# Patient Record
Sex: Female | Born: 1990 | Race: Black or African American | Hispanic: No | Marital: Married | Smoking: Never smoker
Health system: Southern US, Community
[De-identification: ages and names within clinical notes are randomized; demographics above are authoritative.]

---

## 2013-12-06 ENCOUNTER — Emergency Department (HOSPITAL_COMMUNITY)
Admission: EM | Admit: 2013-12-06 | Discharge: 2013-12-06 | Disposition: A | Discharge status: Home / Self Care | Payer: Medicaid - Out of State | Attending: Emergency Medicine | Admitting: Emergency Medicine

## 2013-12-06 ENCOUNTER — Encounter (HOSPITAL_COMMUNITY): Payer: Self-pay | Admitting: Emergency Medicine

## 2013-12-06 ENCOUNTER — Emergency Department (HOSPITAL_COMMUNITY): Payer: Medicaid - Out of State

## 2013-12-06 DIAGNOSIS — Z7982 Long term (current) use of aspirin: Secondary | ICD-10-CM | POA: Diagnosis not present

## 2013-12-06 DIAGNOSIS — R1013 Epigastric pain: Secondary | ICD-10-CM | POA: Diagnosis not present

## 2013-12-06 DIAGNOSIS — R110 Nausea: Secondary | ICD-10-CM | POA: Insufficient documentation

## 2013-12-06 DIAGNOSIS — O9989 Other specified diseases and conditions complicating pregnancy, childbirth and the puerperium: Secondary | ICD-10-CM | POA: Insufficient documentation

## 2013-12-06 DIAGNOSIS — Z3490 Encounter for supervision of normal pregnancy, unspecified, unspecified trimester: Secondary | ICD-10-CM

## 2013-12-06 LAB — CBC WITH DIFFERENTIAL/PLATELET
BASOS ABS: 0 10*3/uL (ref 0.0–0.1)
Basophils Relative: 0 % (ref 0–1)
Eosinophils Absolute: 0.1 10*3/uL (ref 0.0–0.7)
Eosinophils Relative: 1 % (ref 0–5)
HCT: 31.2 % — ABNORMAL LOW (ref 36.0–46.0)
Hemoglobin: 9.7 g/dL — ABNORMAL LOW (ref 12.0–15.0)
LYMPHS ABS: 2.5 10*3/uL (ref 0.7–4.0)
LYMPHS PCT: 30 % (ref 12–46)
MCH: 21.7 pg — ABNORMAL LOW (ref 26.0–34.0)
MCHC: 31.1 g/dL (ref 30.0–36.0)
MCV: 70 fL — ABNORMAL LOW (ref 78.0–100.0)
Monocytes Absolute: 0.4 10*3/uL (ref 0.1–1.0)
Monocytes Relative: 5 % (ref 3–12)
NEUTROS ABS: 5.3 10*3/uL (ref 1.7–7.7)
NEUTROS PCT: 64 % (ref 43–77)
Platelets: 276 10*3/uL (ref 150–400)
RBC: 4.46 MIL/uL (ref 3.87–5.11)
RDW: 17.6 % — AB (ref 11.5–15.5)
WBC: 8.3 10*3/uL (ref 4.0–10.5)

## 2013-12-06 LAB — COMPREHENSIVE METABOLIC PANEL
ALK PHOS: 59 U/L (ref 39–117)
ALT: 16 U/L (ref 0–35)
AST: 16 U/L (ref 0–37)
Albumin: 3.4 g/dL — ABNORMAL LOW (ref 3.5–5.2)
Anion gap: 15 (ref 5–15)
BUN: 8 mg/dL (ref 6–23)
CO2: 22 mEq/L (ref 19–32)
Calcium: 8.4 mg/dL (ref 8.4–10.5)
Chloride: 99 mEq/L (ref 96–112)
Creatinine, Ser: 0.66 mg/dL (ref 0.50–1.10)
GFR calc Af Amer: >90 mL/min (ref >90)
Glucose, Bld: 109 mg/dL — ABNORMAL HIGH (ref 70–99)
POTASSIUM: 4 meq/L (ref 3.7–5.3)
SODIUM: 136 meq/L — AB (ref 137–147)
Total Bilirubin: 0.4 mg/dL (ref 0.3–1.2)
Total Protein: 7.1 g/dL (ref 6.0–8.3)

## 2013-12-06 LAB — URINALYSIS, ROUTINE W REFLEX MICROSCOPIC
Bilirubin Urine: NEGATIVE
GLUCOSE, UA: NEGATIVE mg/dL
HGB URINE DIPSTICK: NEGATIVE
KETONES UR: NEGATIVE mg/dL
Nitrite: NEGATIVE
PH: 5.5 (ref 5.0–8.0)
Protein, ur: NEGATIVE mg/dL
Specific Gravity, Urine: >1.03 — ABNORMAL HIGH (ref 1.005–1.030)
Urobilinogen, UA: 0.2 mg/dL (ref 0.0–1.0)

## 2013-12-06 LAB — URINE MICROSCOPIC-ADD ON

## 2013-12-06 LAB — HCG, QUANTITATIVE, PREGNANCY: hCG, Beta Chain, Quant, S: 14160 m[IU]/mL — ABNORMAL HIGH (ref <5)

## 2013-12-06 LAB — POC URINE PREG, ED: Preg Test, Ur: POSITIVE — AB

## 2013-12-06 NOTE — ED Provider Notes (Signed)
CSN: [id number]     Arrival date & time 12/06/13  1535 History   First MD Initiated Contact with Patient 12/06/13 1708     Chief Complaint  Patient presents with  . Abdominal Pain     (Consider location/radiation/quality/duration/timing/severity/associated sxs/prior Treatment) HPI Comments: 73F [id number] presents with nausea and vomiting. Has had it intermittently for past 7-8 months, roughly an hour every other day. This all began after she was told she had an ectopic pregnancy that suddenly went away - didn't require meds or intervention. 3 days now of constant pain and nausea.  Patient is a 23 y.o. female presenting with abdominal pain. The history is provided by the patient.  Abdominal Pain Pain location:  Epigastric Pain quality: aching   Pain radiates to:  Does not radiate Pain severity:  Moderate Onset quality:  Gradual Duration:  3 days Timing:  Constant Progression:  Worsening Chronicity:  Recurrent Context: not alcohol use, not eating, not previous surgeries and not suspicious food intake   Associated symptoms: nausea   Associated symptoms: no chills, no cough, no fever, no shortness of breath, no vaginal bleeding and no vaginal discharge     History reviewed. No pertinent past medical history. History reviewed. No pertinent past surgical history. History reviewed. No pertinent family history. History  Substance Use Topics  . Smoking status: Never Smoker   . Smokeless tobacco: Not on file  . Alcohol Use: No   OB History   Grav Para Term Preterm Abortions TAB SAB Ect Mult Living                 Review of Systems  Constitutional: Negative for fever and chills.  Respiratory: Negative for cough and shortness of breath.   Gastrointestinal: Positive for nausea and abdominal pain.  Genitourinary: Negative for decreased urine volume, vaginal bleeding, vaginal discharge, difficulty urinating and vaginal pain.  All other systems reviewed and are  negative.     Allergies  Review of patient's allergies indicates no known allergies.  Home Medications   Prior to Admission medications   Medication Sig Start Date End Date Taking? Authorizing Provider  aspirin EC 325 MG tablet Take 325 mg by mouth daily.   Yes Historical Provider, MD   BP 107/69  Pulse 97  Temp(Src) 98.8 F (37.1 C)  Resp 16  SpO2 100%  LMP 10/12/2013 Physical Exam  Nursing note and vitals reviewed. Constitutional: She is oriented to person, place, and time. She appears well-developed and well-nourished. No distress.  HENT:  Head: Normocephalic and atraumatic.  Eyes: EOM are normal. Pupils are equal, round, and reactive to light.  Neck: Normal range of motion. Neck supple.  Cardiovascular: Normal rate and regular rhythm.  Exam reveals no friction rub.   No murmur heard. Pulmonary/Chest: Effort normal and breath sounds normal. No respiratory distress. She has no wheezes. She has no rales.  Abdominal: Soft. She exhibits no distension. There is no tenderness. There is no rebound.  Musculoskeletal: Normal range of motion. She exhibits no edema.  Neurological: She is alert and oriented to person, place, and time.  Skin: She is not diaphoretic.    ED Course  Procedures (including critical care time) Labs Review Labs Reviewed  CBC WITH DIFFERENTIAL - Abnormal; Notable for the following:    Hemoglobin 9.7 (*)    HCT 31.2 (*)    MCV 70.0 (*)    MCH 21.7 (*)    RDW 17.6 (*)    All other components within normal limits  COMPREHENSIVE METABOLIC PANEL - Abnormal; Notable for the following:    Sodium 136 (*)    Glucose, Bld 109 (*)    Albumin 3.4 (*)    All other components within normal limits  URINALYSIS, ROUTINE W REFLEX MICROSCOPIC - Abnormal; Notable for the following:    Specific Gravity, Urine >1.030 (*)    Leukocytes, UA TRACE (*)    All other components within normal limits  URINE MICROSCOPIC-ADD ON - Abnormal; Notable for the following:     Squamous Epithelial / LPF FEW (*)    Bacteria, UA FEW (*)    All other components within normal limits  POC URINE PREG, ED - Abnormal; Notable for the following:    Preg Test, Ur POSITIVE (*)    All other components within normal limits  HCG, QUANTITATIVE, PREGNANCY    Imaging Review US Ob Comp Less 14 Wks  12/06/2013   CLINICAL DATA:  23 year old pregnant female with pelvic pain. Estimated gestational age of [redacted] weeks 6 day by LMP. Beta HCG of 14,160  EXAM: OBSTETRIC <14 WK [place] AND TRANSVAGINAL OB US  TECHNIQUE: Both transabdominal and transvaginal ultrasound examinations were performed for complete evaluation of the gestation as well as the maternal uterus, adnexal regions, and pelvic cul-de-sac. Transvaginal technique was performed to assess early pregnancy.  COMPARISON:  None.  FINDINGS: Intrauterine gestational sac: Visualized/normal in shape.  Yolk sac:  Visualized  Embryo:  Not visualized  Cardiac Activity: Not visualized  MSD:  12.3  mm   6 w   0  d                [place] EDC: 08/01/2014  Maternal uterus/adnexae: There is no evidence of subchorionic hemorrhage.  A 1.7 x 2.2 cm right ovarian cyst is present.  The left ovary is unremarkable.  There is no evidence of free fluid or solid adnexal mass.  IMPRESSION: Single intrauterine gestational sac containing yolk sac. Fetal pole is not identified at this time. No evidence of subchorionic hemorrhage.  1.7 x 2.2 cm right ovarian cyst.   Electronically Signed   By: Laveda Abbe M.D.   On: 12/06/2013 19:58   US Ob Transvaginal  12/06/2013   CLINICAL DATA:  23 year old pregnant female with pelvic pain. Estimated gestational age of [redacted] weeks 6 day by LMP. Beta HCG of 14,160  EXAM: OBSTETRIC <14 WK [place] AND TRANSVAGINAL OB US  TECHNIQUE: Both transabdominal and transvaginal ultrasound examinations were performed for complete evaluation of the gestation as well as the maternal uterus, adnexal regions, and pelvic cul-de-sac. Transvaginal technique was performed to assess  early pregnancy.  COMPARISON:  None.  FINDINGS: Intrauterine gestational sac: Visualized/normal in shape.  Yolk sac:  Visualized  Embryo:  Not visualized  Cardiac Activity: Not visualized  MSD:  12.3  mm   6 w   0  d                [place] EDC: 08/01/2014  Maternal uterus/adnexae: There is no evidence of subchorionic hemorrhage.  A 1.7 x 2.2 cm right ovarian cyst is present.  The left ovary is unremarkable.  There is no evidence of free fluid or solid adnexal mass.  IMPRESSION: Single intrauterine gestational sac containing yolk sac. Fetal pole is not identified at this time. No evidence of subchorionic hemorrhage.  1.7 x 2.2 cm right ovarian cyst.   Electronically Signed   By: Laveda Abbe M.D.   On: 12/06/2013 19:58     EKG Interpretation None  MDM   Final diagnoses:  Pregnancy    48F here with 3 days of constant abdominal pain and nausea. Hx of ectopic that "went away" on a follow up visit. No fevers, diarrhea, vaginal bleeding, dysuria, vaginal discharge. Here with stable vitals.  Pregnant here, labs ok. Will check beta-hcg quant and obtain US. LMP 5-6 weeks ago.  US shows IUP. Instructed to f/u with OB/GYN.  Dagmar Hait, MD 12/07/13 [phone]

## 2013-12-06 NOTE — ED Notes (Signed)
Discharge instructions given voiced understanding

## 2013-12-06 NOTE — ED Notes (Signed)
She reports abd pain x 3 days. shes also had some nausea. She is concerned because she was diagnosed with an ectopic pregnancy in November and then was told "that it went away." shes had irregular periods since november

## 2013-12-06 NOTE — ED Notes (Signed)
Pt c/o lower abd pain, worse x 3 days. Reports intermittent pain since November, when told she had an ectopic pregnancy.  Pt states she had an US in November, followed up in December and was told pregnancy "disappeared." Pt reports menstrual periods have been irregular since December.  LMP June 15 per patient.  Denies vaginal discharge or bleeding.

## 2015-08-01 IMAGING — US US OB COMP LESS 14 WK
1 series · 14 of 27 positions shown · non-contrast
Comparison: None.

CLINICAL DATA: 22-year-old pregnant female with pelvic pain.
Estimated gestational age of 5 weeks 6 day by LMP. Beta HCG of
14,160

EXAM:
OBSTETRIC <14 WK US AND TRANSVAGINAL OB US
TECHNIQUE: Both transabdominal and transvaginal ultrasound examinations were
performed for complete evaluation of the gestation as well as the
maternal uterus, adnexal regions, and pelvic cul-de-sac.
Transvaginal technique was performed to assess early pregnancy.

[Series 1: us ob comp less 14 wk · 0.15mm/px · 14 of 27 slices shown]
[im 1/27]
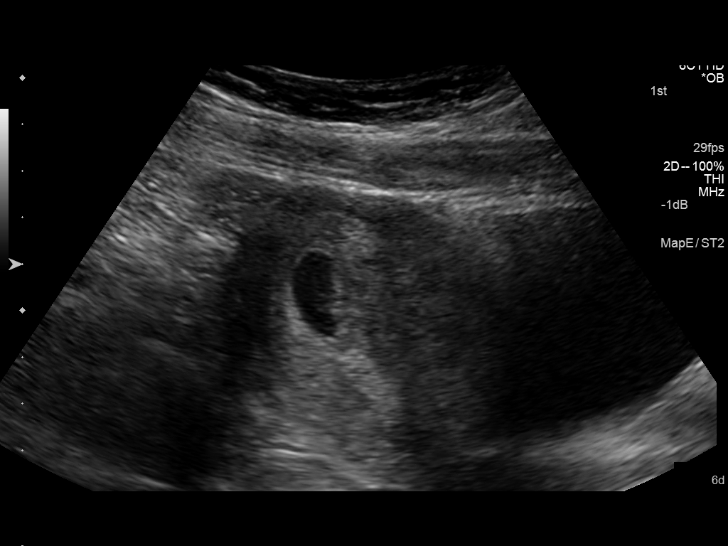
[im 3/27]
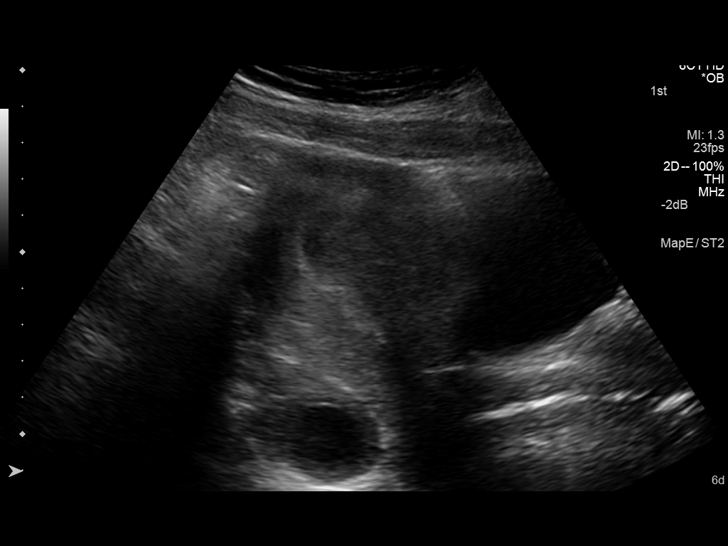
[im 5/27]
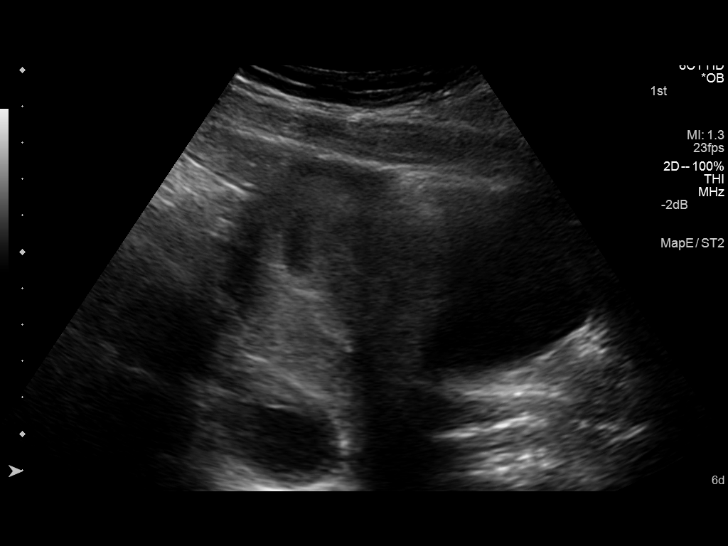
[im 7/27]
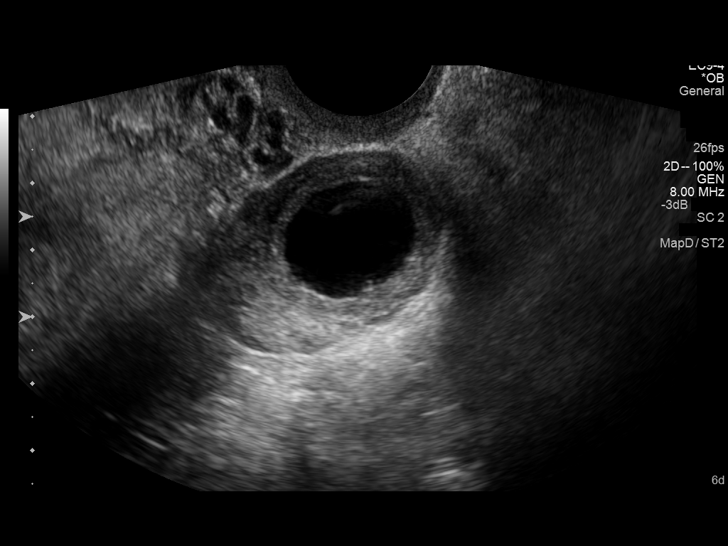
[im 9/27]
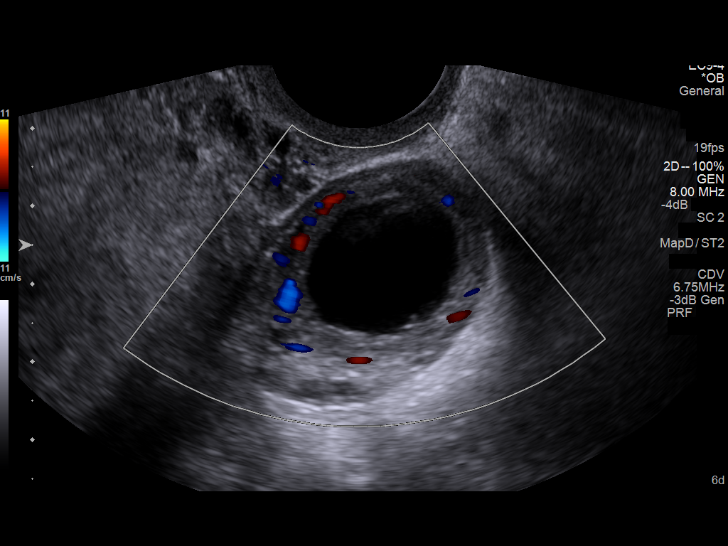
[im 11/27]
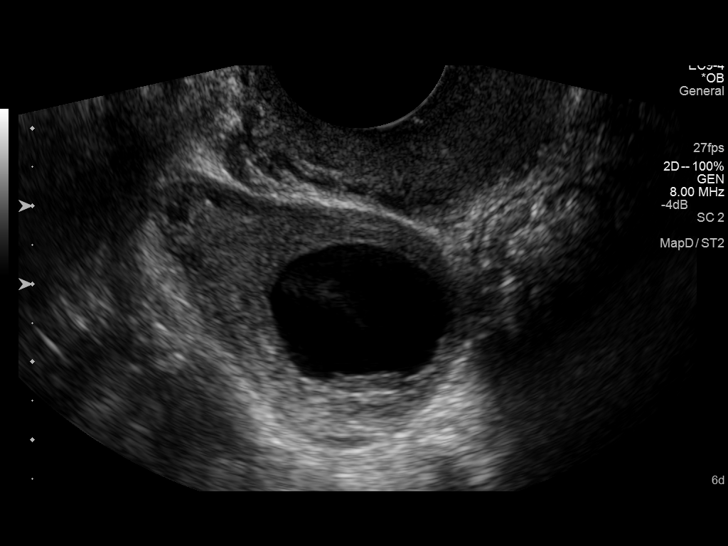
[im 13/27]
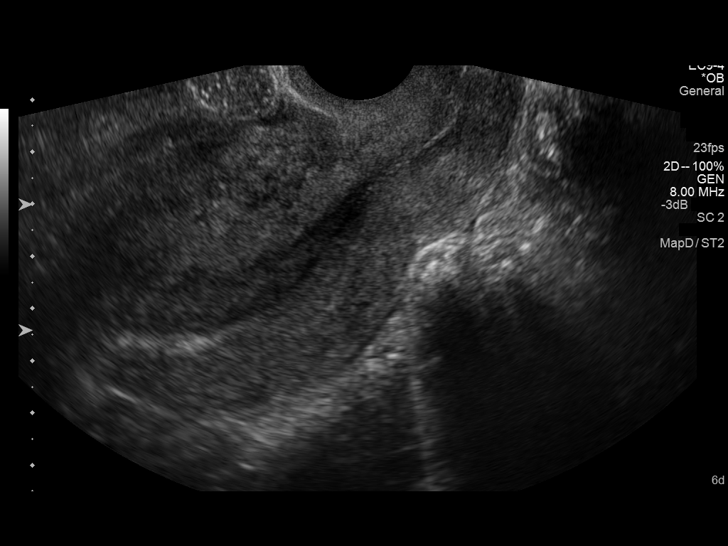
[im 15/27]
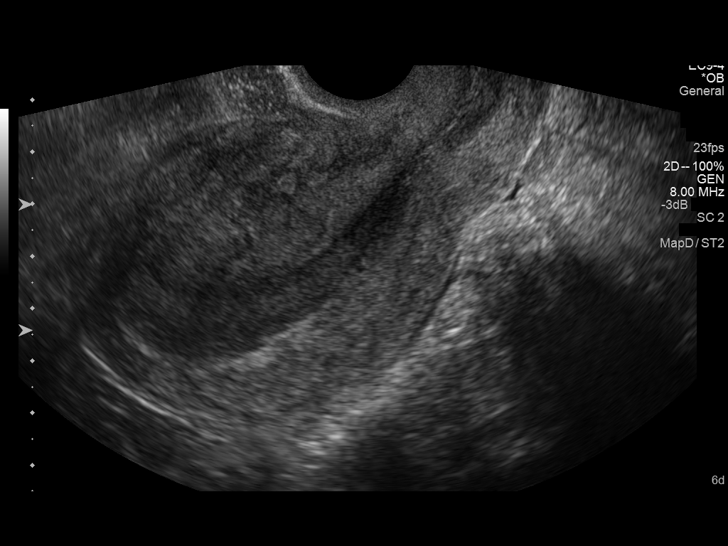
[im 17/27]
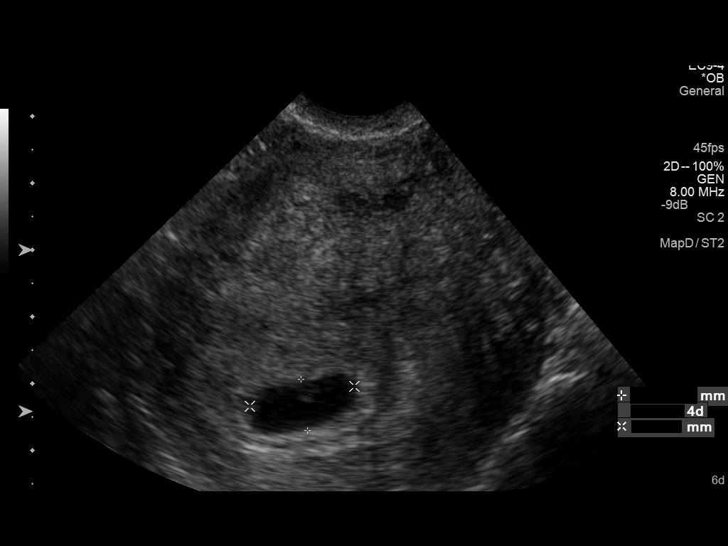
[im 19/27]
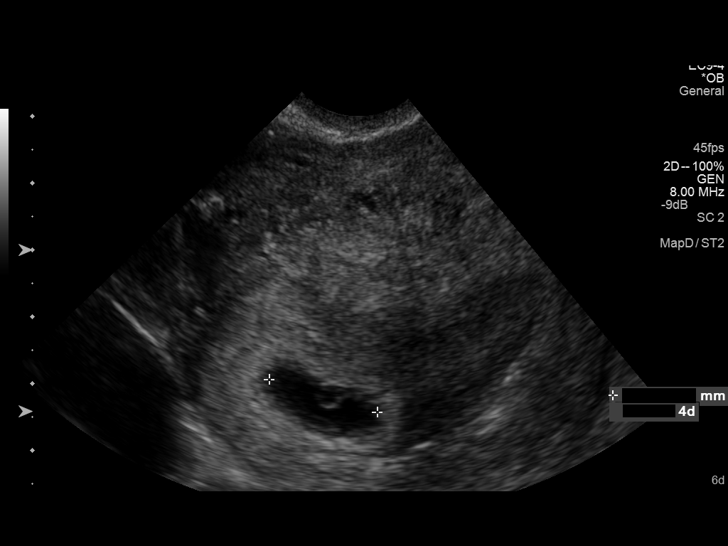
[im 21/27]
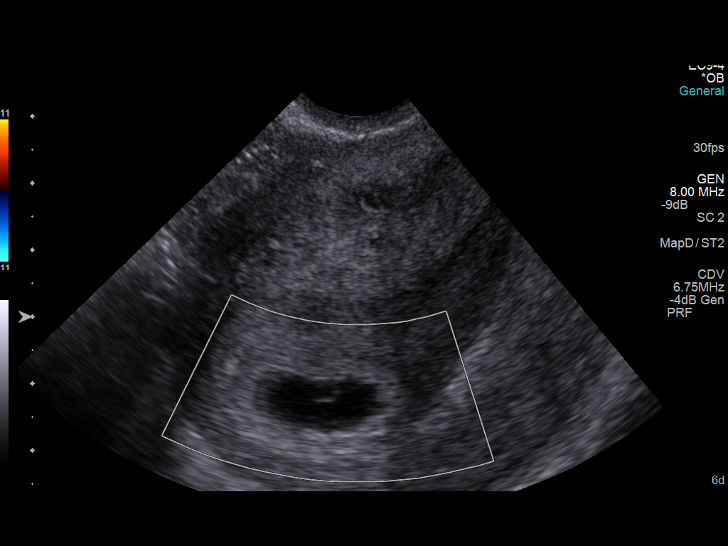
[im 23/27]
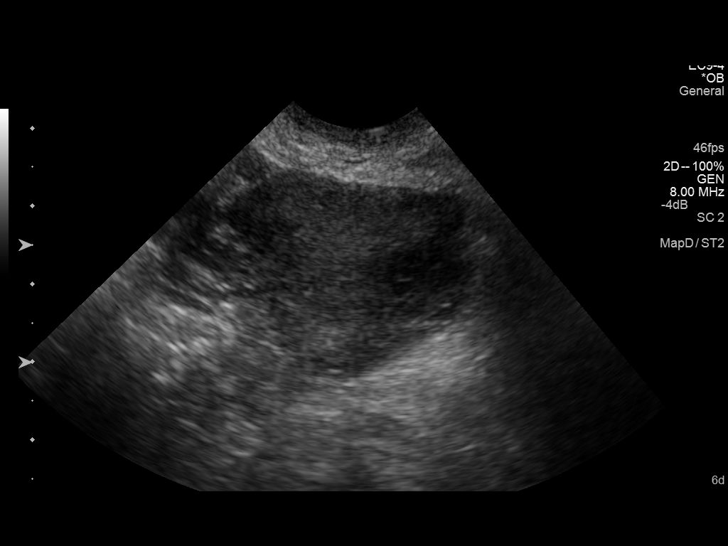
[im 25/27]
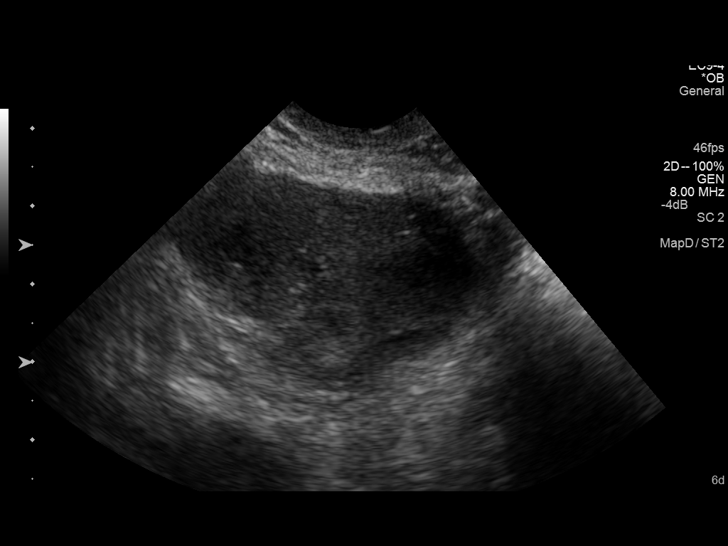
[im 27/27]
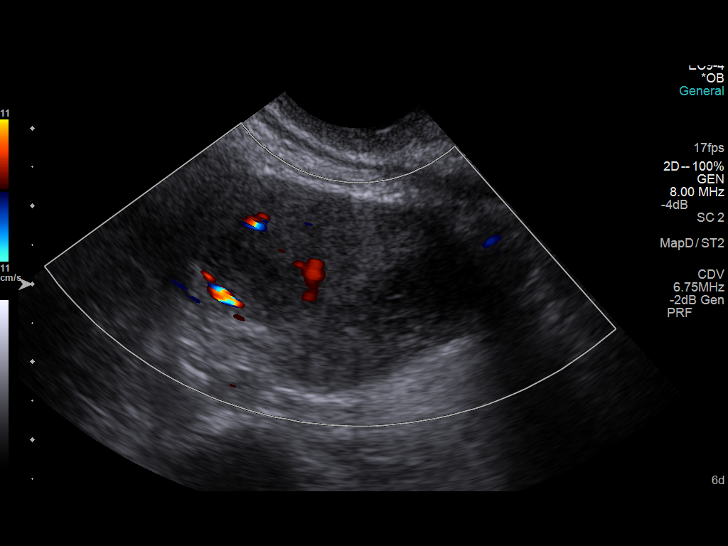

[14 of 27 positions shown; findings below may reference images not displayed]

FINDINGS: Intrauterine gestational sac: Visualized/normal in shape.

Yolk sac:  Visualized

Embryo:  Not visualized

Cardiac Activity: Not visualized

MSD:  12.3  mm   6 w   0  d                [place] EDC: 08/01/2014

Maternal uterus/adnexae: There is no evidence of subchorionic
hemorrhage.

A 1.7 x 2.2 cm right ovarian cyst is present.

The left ovary is unremarkable.

There is no evidence of free fluid or solid adnexal mass.
IMPRESSION: Single intrauterine gestational sac containing yolk sac. Fetal pole
is not identified at this time. No evidence of subchorionic
hemorrhage.

1.7 x 2.2 cm right ovarian cyst.
